# Patient Record
Sex: Female | Born: 1990 | Race: White | Hispanic: No | Marital: Single | Smoking: Never smoker
Health system: Southern US, Community
[De-identification: ages and names within clinical notes are randomized; demographics above are authoritative.]

## PROBLEM LIST (undated history)

## (undated) HISTORY — PX: TONSILLECTOMY: SUR1361

---

## 2019-03-24 ENCOUNTER — Emergency Department (HOSPITAL_COMMUNITY): Payer: No Typology Code available for payment source

## 2019-03-24 ENCOUNTER — Other Ambulatory Visit: Payer: Self-pay

## 2019-03-24 ENCOUNTER — Encounter (HOSPITAL_COMMUNITY): Payer: Self-pay | Admitting: Emergency Medicine

## 2019-03-24 ENCOUNTER — Emergency Department (HOSPITAL_COMMUNITY)
Admission: EM | Admit: 2019-03-24 | Discharge: 2019-03-25 | Disposition: A | Discharge status: Home / Self Care | Payer: No Typology Code available for payment source | Attending: Emergency Medicine | Admitting: Emergency Medicine

## 2019-03-24 DIAGNOSIS — J1289 Other viral pneumonia: Secondary | ICD-10-CM | POA: Insufficient documentation

## 2019-03-24 DIAGNOSIS — R0602 Shortness of breath: Secondary | ICD-10-CM | POA: Diagnosis present

## 2019-03-24 DIAGNOSIS — U071 COVID-19: Secondary | ICD-10-CM

## 2019-03-24 DIAGNOSIS — Z79899 Other long term (current) drug therapy: Secondary | ICD-10-CM | POA: Diagnosis not present

## 2019-03-24 DIAGNOSIS — J129 Viral pneumonia, unspecified: Secondary | ICD-10-CM

## 2019-03-24 LAB — COMPREHENSIVE METABOLIC PANEL
ALT: 29 U/L (ref 0–44)
AST: 21 U/L (ref 15–41)
Albumin: 4 g/dL (ref 3.5–5.0)
Alkaline Phosphatase: 55 U/L (ref 38–126)
Anion gap: 12 (ref 5–15)
BUN: 11 mg/dL (ref 6–20)
CO2: 23 mmol/L (ref 22–32)
Calcium: 9 mg/dL (ref 8.9–10.3)
Chloride: 106 mmol/L (ref 98–111)
Creatinine, Ser: 0.61 mg/dL (ref 0.44–1.00)
GFR calc Af Amer: >60 mL/min (ref >60)
GFR calc non Af Amer: >60 mL/min (ref >60)
Glucose, Bld: 87 mg/dL (ref 70–99)
Potassium: 3.7 mmol/L (ref 3.5–5.1)
Sodium: 141 mmol/L (ref 135–145)
Total Bilirubin: 0.6 mg/dL (ref 0.3–1.2)
Total Protein: 7.5 g/dL (ref 6.5–8.1)

## 2019-03-24 LAB — CBC WITH DIFFERENTIAL/PLATELET
Abs Immature Granulocytes: 0.02 10*3/uL (ref 0.00–0.07)
Basophils Absolute: 0 10*3/uL (ref 0.0–0.1)
Basophils Relative: 0 %
Eosinophils Absolute: 0 10*3/uL (ref 0.0–0.5)
Eosinophils Relative: 1 %
HCT: 42.8 % (ref 36.0–46.0)
Hemoglobin: 14 g/dL (ref 12.0–15.0)
Immature Granulocytes: 0 %
Lymphocytes Relative: 33 %
Lymphs Abs: 1.5 10*3/uL (ref 0.7–4.0)
MCH: 30.4 pg (ref 26.0–34.0)
MCHC: 32.7 g/dL (ref 30.0–36.0)
MCV: 92.8 fL (ref 80.0–100.0)
Monocytes Absolute: 0.4 10*3/uL (ref 0.1–1.0)
Monocytes Relative: 9 %
Neutro Abs: 2.6 10*3/uL (ref 1.7–7.7)
Neutrophils Relative %: 57 %
Platelets: 247 10*3/uL (ref 150–400)
RBC: 4.61 MIL/uL (ref 3.87–5.11)
RDW: 12.9 % (ref 11.5–15.5)
WBC: 4.7 10*3/uL (ref 4.0–10.5)
nRBC: 0 % (ref 0.0–0.2)

## 2019-03-24 LAB — TROPONIN I (HIGH SENSITIVITY): Troponin I (High Sensitivity): <2 ng/L (ref <18)

## 2019-03-24 MED ORDER — IOHEXOL 350 MG/ML SOLN
100.0000 mL | Freq: Once | INTRAVENOUS | Status: AC | PRN
  Administered 2019-03-25: 100 mL via INTRAVENOUS

## 2019-03-24 MED ORDER — SODIUM CHLORIDE 0.9 % IV SOLN: Freq: Once | INTRAVENOUS | Status: DC

## 2019-03-24 NOTE — ED Triage Notes (Signed)
Pt C/O SOB that started last night. Pt also C/o chest pain. Pt had positive COVID on October 30th.

## 2019-03-24 NOTE — ED Provider Notes (Signed)
[facility] EMERGENCY DEPARTMENT Provider Note   CSN: [id number] Arrival date & time: 03/24/19  1921     History   Chief Complaint Chief Complaint  Patient presents with  . Shortness of Breath    HPI Laura Cannon is a 28 y.o. female history of C-section, tonsillectomy, no chronic medical conditions.  Patient reports diagnosis of COVID-19 virus on 03/16/2019.  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  Patient reports she developed symptoms on 03/15/2019.  She reports that since her diagnosis she has had intermittent body aches and a mild nonproductive cough, she reports that she had been feeling well until today around 2 PM.  She reports that she developed a chest pain a sharp stabbing sensation primarily in the lower part of her chest but occasionally radiates around to different areas of her chest.  She reports this pain is worse with coughing.  She denies any clear alleviating factors.  She reports she is also been feeling short of breath increasingly over the past 2 days.  Reports intermittent fevers up to 101 F no fever in the past few days.  She denies headache, neck pain, hemoptysis, abdominal pain, nausea/vomiting, diarrhea, extremity pain/swelling, recent surgery/immobilization, history of blood clot or exogenous hormone use.     HPI  History reviewed. No pertinent past medical history.  There are no active problems to display for this patient.   Past Surgical History:  Procedure Laterality Date  . CESAREAN SECTION    . TONSILLECTOMY       OB History   No obstetric history on file.      Home Medications    Prior to Admission medications   Medication Sig Start Date End Date Taking? Authorizing Provider  dextroamphetamine (DEXEDRINE SPANSULE) 15 MG 24 hr capsule Take 15 mg by mouth daily.   Yes [provider]  dextroamphetamine (DEXTROSTAT) 10 MG tablet Take 5 mg by mouth daily.   Yes  [provider]  guaiFENesin (MUCINEX) 600 MG 12 hr tablet Take 600 mg by mouth 2 (two) times daily as needed for cough or to loosen phlegm.   Yes [provider]  vitamin C (ASCORBIC ACID) 500 MG tablet Take 500 mg by mouth daily.   Yes [provider]  Vitamin D, Cholecalciferol, 10 MCG (400 UNIT) CAPS Take 1 capsule by mouth daily.   Yes [provider]  azithromycin (ZITHROMAX Z-PAK) 250 MG tablet Take 500 mg (2 tablets) on day 1, then 250 mg (1 tablet) once daily for the next 4 days. 03/25/19   Harlene Salts A, PA-C  predniSONE (DELTASONE) 10 MG tablet Take 5 tablets (50 mg total) by mouth daily for 4 days. 03/25/19 03/29/19  Harlene Salts A, PA-C  zinc gluconate 50 MG tablet Take 1 tablet (50 mg total) by mouth daily. 03/25/19   Bill Salinas, PA-C    Family History No family history on file.  Social History Social History   Tobacco Use  . Smoking status: Never Smoker  . Smokeless tobacco: Never Used  Substance Use Topics  . Alcohol use: Not Currently  . Drug use: Not Currently     Allergies   Patient has no known allergies.   Review of Systems Review of Systems Ten systems are reviewed and are negative for acute change except as noted in the HPI   Physical Exam Updated Vital Signs BP 125/90   Pulse 90  Temp 98.3 F (36.8 C) (Oral)   Resp (!) 23   Ht 5\' 6"  (1.676 m)   Wt 94.8 kg   LMP 03/22/2019   SpO2 100%   BMI 33.73 kg/m   Physical Exam Constitutional:      General: She is not in acute distress.    Appearance: Normal appearance. She is well-developed. She is not ill-appearing or diaphoretic.  HENT:     Head: Normocephalic and atraumatic.     Right Ear: External ear normal.     Left Ear: External ear normal.     Nose: Nose normal.  Eyes:     General: Vision grossly intact. Gaze aligned appropriately.     Pupils: Pupils are equal, round, and reactive to light.  Neck:     Musculoskeletal: Normal range of  motion.     Trachea: Trachea and phonation normal. No tracheal deviation.  Cardiovascular:     Rate and Rhythm: Normal rate and regular rhythm.     Pulses: Normal pulses.     Heart sounds: Normal heart sounds.  Pulmonary:     Effort: Pulmonary effort is normal. No accessory muscle usage or respiratory distress.     Breath sounds: Normal breath sounds.  Abdominal:     General: There is no distension.     Palpations: Abdomen is soft.     Tenderness: There is no abdominal tenderness. There is no guarding or rebound.  Musculoskeletal: Normal range of motion.  Skin:    General: Skin is warm and dry.  Neurological:     Mental Status: She is alert.     GCS: GCS eye subscore is 4. GCS verbal subscore is 5. GCS motor subscore is 6.     Comments: Speech is clear and goal oriented, follows commands Major Cranial nerves without deficit, no facial droop Moves extremities without ataxia, coordination intact  Psychiatric:        Behavior: Behavior normal.    ED Treatments / Results  Labs (all labs ordered are listed, but only abnormal results are displayed) Labs Reviewed  CBC WITH DIFFERENTIAL/PLATELET  COMPREHENSIVE METABOLIC PANEL  TROPONIN I (HIGH SENSITIVITY)    EKG EKG Interpretation  Date/Time:  Saturday March 24 2019 19:36:07 EST Ventricular Rate:  111 PR Interval:  116 QRS Duration: 82 QT Interval:  314 QTC Calculation: 427 R Axis:   129 Text Interpretation: Sinus tachycardia Right axis deviation T wave abnormality, consider inferior ischemia Abnormal ECG Confirmed by Vanetta MuldersZackowski, Scott [phone]([id number]) on 03/24/2019 7:42:03 PM   Radiology Ct Angio Chest Pe W And/or Wo Contrast  Result Date: 03/25/2019 CLINICAL DATA:  Shortness of breath EXAM: CT ANGIOGRAPHY CHEST WITH CONTRAST TECHNIQUE: Multidetector CT imaging of the chest was performed using the standard protocol during bolus administration of intravenous contrast. Multiplanar CT image reconstructions and MIPs were obtained to  evaluate the vascular anatomy. CONTRAST:  100mL OMNIPAQUE IOHEXOL 350 MG/ML SOLN COMPARISON:  None. FINDINGS: Cardiovascular: Evaluation for pulmonary emboli is limited by respiratory motion artifact.Given these limitations, no PE was identified. The main pulmonary artery is dilated measuring approximately 3.1 cm. There is no CT evidence of acute right heart strain. The visualized aorta is normal. Heart size is normal. There is a trace pericardial effusion. Mediastinum/Nodes: --there is presumed mild reactive adenopathy within the mediastinum. --No axillary lymphadenopathy. --No supraclavicular lymphadenopathy. --Normal thyroid gland. --The esophagus is unremarkable Lungs/Pleura: There are bibasilar ground-glass airspace opacities and consolidation. There are smaller ground-glass airspace opacities throughout the remaining lobes. There is no pneumothorax. No  large pleural effusion. The trachea is unremarkable. Upper Abdomen: No acute abnormality detected in the upper abdomen. Musculoskeletal: No chest wall abnormality. No acute or significant osseous findings. Review of the MIP images confirms the above findings. IMPRESSION: 1. Evaluation for pulmonary emboli is limited by respiratory motion artifact. Given this limitation, no PE was identified. 2. Multifocal ground-glass airspace opacities with areas of consolidation at the lung bases is concerning for multifocal pneumonia (viral or bacterial). Electronically Signed   By: Katherine Mantle M.D.   On: 03/25/2019 00:37   Dg Chest Portable 1 View  Result Date: 03/24/2019 CLINICAL DATA:  Shortness of breath with chest pain EXAM: PORTABLE CHEST 1 VIEW COMPARISON:  None. FINDINGS: There are possible streaky hazy ground-glass bilateral airspace opacities. The lung volumes are somewhat low. The heart size is normal. There is no pneumothorax or significant pleural effusion. There is no acute osseous abnormality. IMPRESSION: Subtle streaky bilateral airspace opacities  nonspecific but can be seen in patients with an atypical infectious process such as viral pneumonia. Electronically Signed   By: Katherine Mantle M.D.   On: 03/24/2019 20:20    Procedures Procedures (including critical care time)  Medications Ordered in ED Medications  0.9 %  sodium chloride infusion (has no administration in time range)  predniSONE (DELTASONE) tablet 60 mg (has no administration in time range)  albuterol (VENTOLIN HFA) 108 (90 Base) MCG/ACT inhaler 1-2 puff (has no administration in time range)  iohexol (OMNIPAQUE) 350 MG/ML injection 100 mL (100 mLs Intravenous Contrast Given 03/25/19 0005)     Initial Impression / Assessment and Plan / ED Course  I have reviewed the triage vital signs and the nursing notes.  Pertinent labs & imaging results that were available during my care of the patient were reviewed by me and considered in my medical decision making (see chart for details).  Clinical Course as of Mar 24 112  Sat Mar 24, 2019  2248 CT PE study, if negative prednisone 60 mg here and 5-day burst. Multivitamin and Zinc ( ) vitamin D.   [BM]    Clinical Course User Index [BM] Harlene Salts A, PA-C      CBC within normal limits CMP within normal limits High-sensitivity troponin: <2, pain onset 2 PM, no indication for delta troponin Chest x-ray:  IMPRESSION:  Subtle streaky bilateral airspace opacities nonspecific but can be  seen in patients with an atypical infectious process such as viral  pneumonia.   EKG: Sinus tachycardia Right axis deviation T wave abnormality, consider inferior ischemia Abnormal ECG Confirmed by Vanetta Mulders [phone]) on 03/24/2019 7:42:03 PM - Case discussed with Dr. Deretha Emory, likely tachycardia secondary to her known COVID-19 viral infection however with new chest pain will obtain CT pulmonary embolism study.  Pending no pulmonary embolism plan of care that is if negative for pulmonary embolism to treat patient with  prednisone burst, multivitamin with zinc and vitamin D and discharge. - CT Angio PE Study: IMPRESSION:  1. Evaluation for pulmonary emboli is limited by respiratory motion  artifact. Given this limitation, no PE was identified.  2. Multifocal ground-glass airspace opacities with areas of  consolidation at the lung bases is concerning for multifocal  pneumonia (viral or bacterial).  - Patient with multifocal groundglass opacities consistent with her known COVID-19 viral infection.  She has no recent fever, no leukocytosis, I have low suspicion for bacterial pneumonia at this time however as the radiologist does read as possible bacteria I discussed the case with Dr. Lynelle Doctor will cover  patient with Z-Pak.  As per previous care plan will treat patient with prednisone burst, multivitamin, zinc and vitamin D.  Patient is to follow-up with PCP in the next 2 days for recheck and to return to the emergency department immediately for any new or worsening symptoms. - On reassessment patient is resting comfortably no acute distress heart rate approximately 90 bpm, SPO2 100% on room air, no tachypnea, respirations unlabored, blood pressure stable.  Patient feels well she states understanding of care plan and is agreeable to above.  She will be given albuterol inhaler as needed, she has no further questions or concerns she states understanding to follow-up with PCP this week and return to the ER for any new or worsening symptoms.  At this time there does not appear to be any evidence of an acute emergency medical condition and the patient appears stable for discharge with appropriate outpatient follow up. Diagnosis was discussed with patient who verbalizes understanding of care plan and is agreeable to discharge. I have discussed return precautions with patient who verbalizes understanding of return precautions. Patient encouraged to follow-up with their PCP. All questions answered.  Jaedin Trumbo was evaluated in  Emergency Department on 03/25/2019 for the symptoms described in the history of present illness. She was evaluated in the context of the global COVID-19 pandemic, which necessitated consideration that the patient might be at risk for infection with the SARS-CoV-2 virus that causes COVID-19. Institutional protocols and algorithms that pertain to the evaluation of patients at risk for COVID-19 are in a state of rapid change based on information released by regulatory bodies including the CDC and federal and state organizations. These policies and algorithms were followed during the patient's care in the ED.  Note: Portions of this report may have been transcribed using voice recognition software. Every effort was made to ensure accuracy; however, inadvertent computerized transcription errors may still be present. Final Clinical Impressions(s) / ED Diagnoses   Final diagnoses:  [id number] virus detected  Viral pneumonia    ED Discharge Orders         Ordered    predniSONE (DELTASONE) 10 MG tablet  Daily     03/25/19 0100    zinc gluconate 50 MG tablet  Daily     03/25/19 0100    azithromycin (ZITHROMAX Z-PAK) 250 MG tablet     03/25/19 0104           Deliah Boston, PA-C 03/25/19 0114    Fredia Sorrow, MD 03/28/19 1831

## 2019-03-25 MED ORDER — ZINC GLUCONATE 50 MG PO TABS: 50.0000 mg | ORAL_TABLET | Freq: Every day | ORAL | 0 refills | Status: AC

## 2019-03-25 MED ORDER — PREDNISONE 10 MG PO TABS: 50.0000 mg | ORAL_TABLET | Freq: Every day | ORAL | 0 refills | Status: AC

## 2019-03-25 MED ORDER — AZITHROMYCIN 250 MG PO TABS: ORAL_TABLET | ORAL | 0 refills | Status: AC

## 2019-03-25 MED ORDER — ALBUTEROL SULFATE HFA 108 (90 BASE) MCG/ACT IN AERS
1.0000 | INHALATION_SPRAY | Freq: Once | RESPIRATORY_TRACT | Status: AC
  Administered 2019-03-25: 2 via RESPIRATORY_TRACT
  Filled 2019-03-25: qty 6.7

## 2019-03-25 MED ORDER — PREDNISONE 50 MG PO TABS
60.0000 mg | ORAL_TABLET | Freq: Once | ORAL | Status: AC
  Administered 2019-03-25: 60 mg via ORAL
  Filled 2019-03-25: qty 1

## 2019-03-25 NOTE — Discharge Instructions (Addendum)
You have been diagnosed today with COVID-19 virus, viral pneumonia.  At this time there does not appear to be the presence of an emergent medical condition, however there is always the potential for conditions to change. Please read and follow the below instructions.  Please return to the Emergency Department immediately for any new or worsening symptoms or if your symptoms do not improve within 3 days. Please be sure to follow up with your Primary Care Provider within one week regarding your visit today; please call their office to schedule an appointment even if you are feeling better for a follow-up visit. Your CT scan today showed pneumonia which is likely viral related to your COVID-19 infection, however there is a chance this could be due to a bacteria as well, please take the antibiotic azithromycin as prescribed for treatment of possible bacterial causes. You have been given your first dose of prednisone today, this is a steroid medication, please continue your prednisone burst starting tomorrow with 50 mg daily until complete to help with your symptoms. Please take the zinc supplement prescribed to you today to help with your symptoms, please also begin taking a multivitamin containing vitamin D to help with your symptoms.   Please call your primary care doctor's office tomorrow to schedule a follow-up appointment and return to the emergency department immediately for any new or worsening symptoms. Please continue to self quarantine until symptom-free for 10 days.  Get help right away if: You have worsening shortness of breath. You have increased chest pain. Your sickness becomes worse, especially if you are an older adult or have a weakened immune system. You cough up blood. You have any new/concerning or worsening of symptoms.  Please read the additional information packets attached to your discharge summary.  Do not take your medicine if  develop an itchy rash, swelling in your mouth  or lips, or difficulty breathing; call 911 and seek immediate emergency medical attention if this occurs.  Note: Portions of this text may have been transcribed using voice recognition software. Every effort was made to ensure accuracy; however, inadvertent computerized transcription errors may still be present.

## 2020-11-15 IMAGING — CT CT ANGIO CHEST
2 of 6 series · 17 of 46 positions shown · IV contrast (omnipaque)
Comparison: None.

CLINICAL DATA: Shortness of breath

EXAM:
CT ANGIOGRAPHY CHEST WITH CONTRAST
TECHNIQUE: Multidetector CT imaging of the chest was performed using the
standard protocol during bolus administration of intravenous
contrast. Multiplanar CT image reconstructions and MIPs were
obtained to evaluate the vascular anatomy.
CONTRAST:  100mL OMNIPAQUE IOHEXOL 350 MG/ML SOLN

[Series 5: pe axial thins · axial · 0.79mm/px · z∈[+1149,+1372]mm · 14 of 245 slices shown]
[im 11/245  lung]
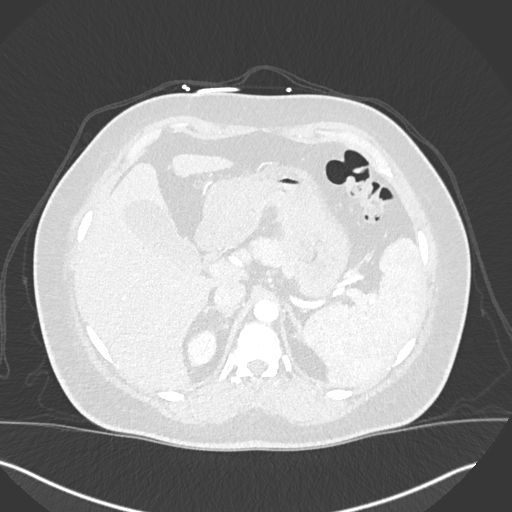
[im 32/245  soft-tissue]
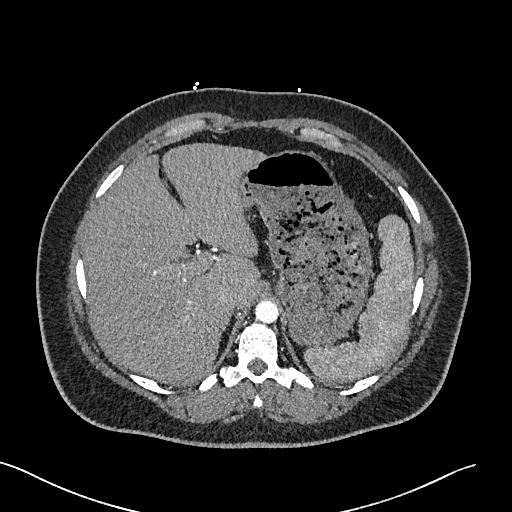
[im 43/245  lung]
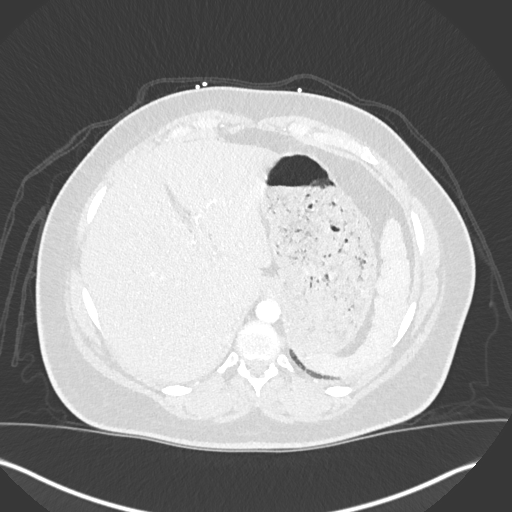
[im 64/245  soft-tissue]
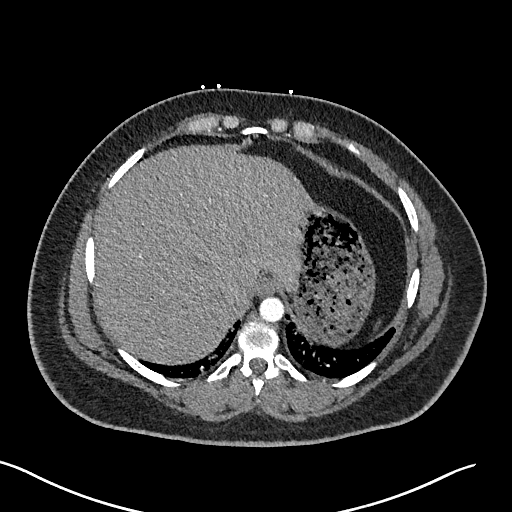
[im 85/245  lung]
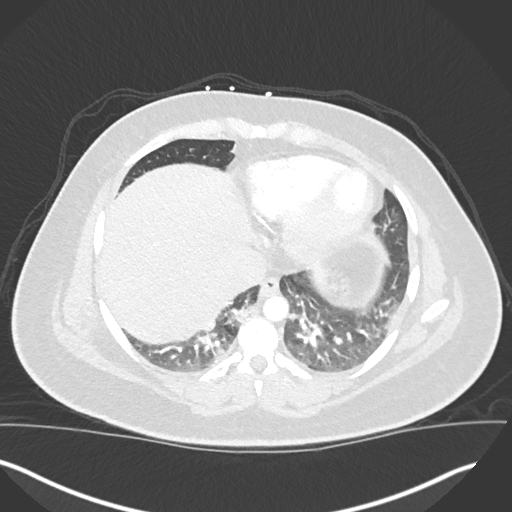
[im 96/245  soft-tissue]
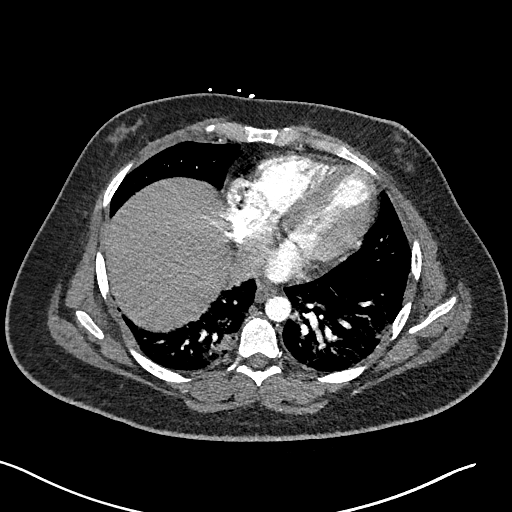
[im 117/245  lung]
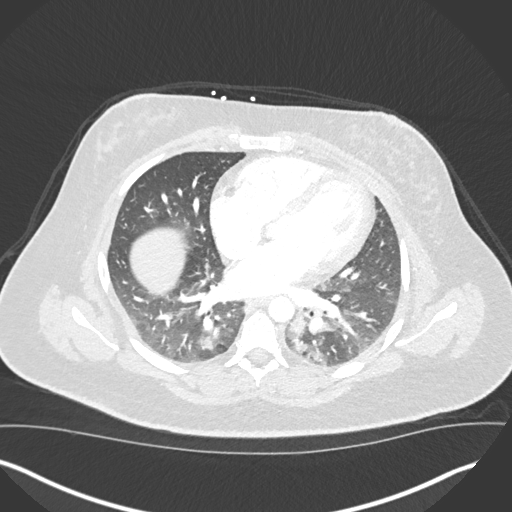
[im 128/245  soft-tissue]
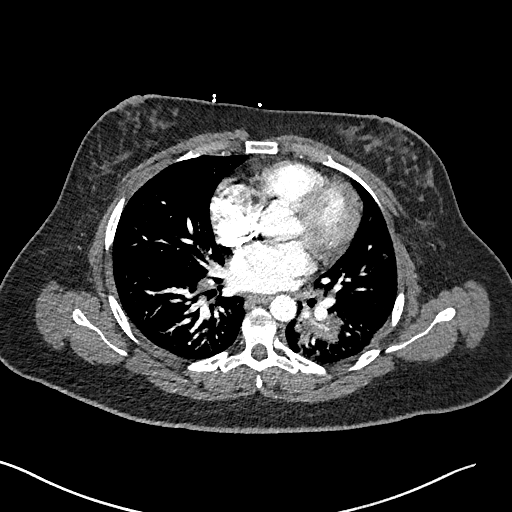
[im 149/245  lung]
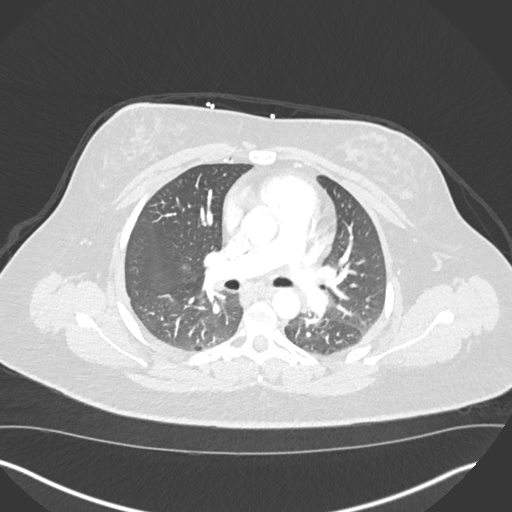
[im 160/245  soft-tissue]
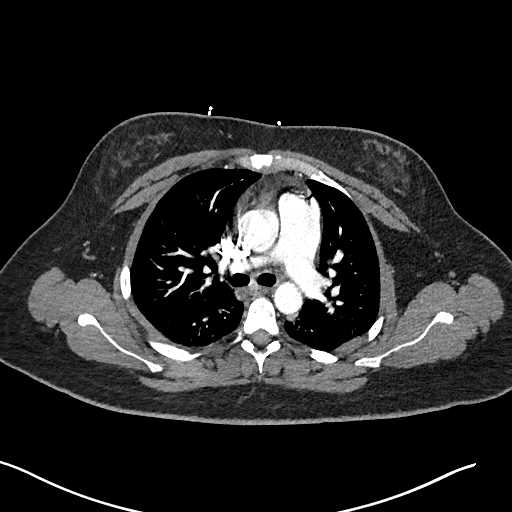
[im 181/245  lung]
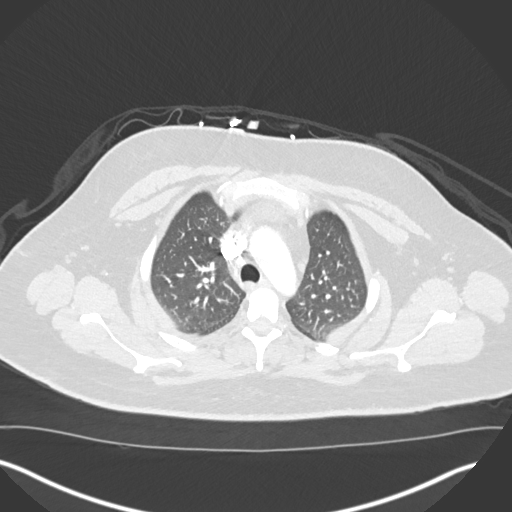
[im 202/245  soft-tissue]
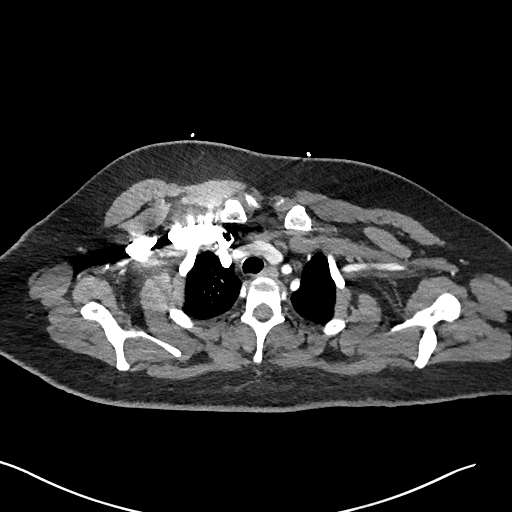
[im 213/245  lung]
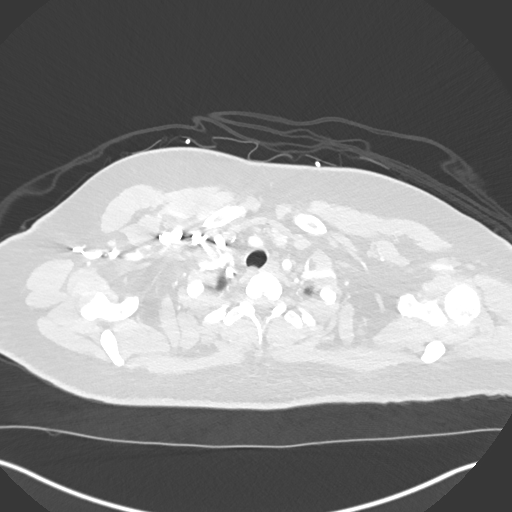
[im 234/245  soft-tissue]
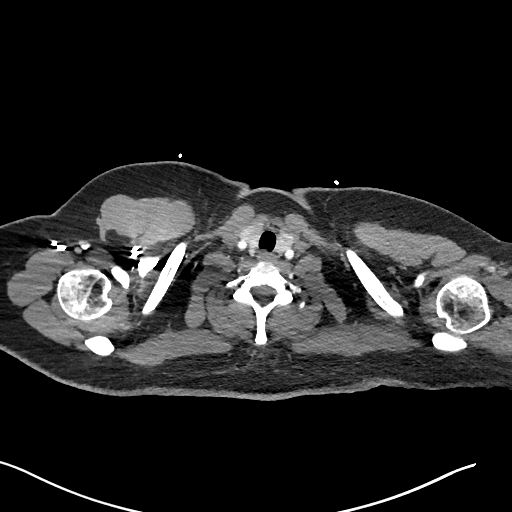

[Series 7: cor soft · coronal · 0.52mm/px · 3 of 146 slices shown]
[im 37/146  soft-tissue]
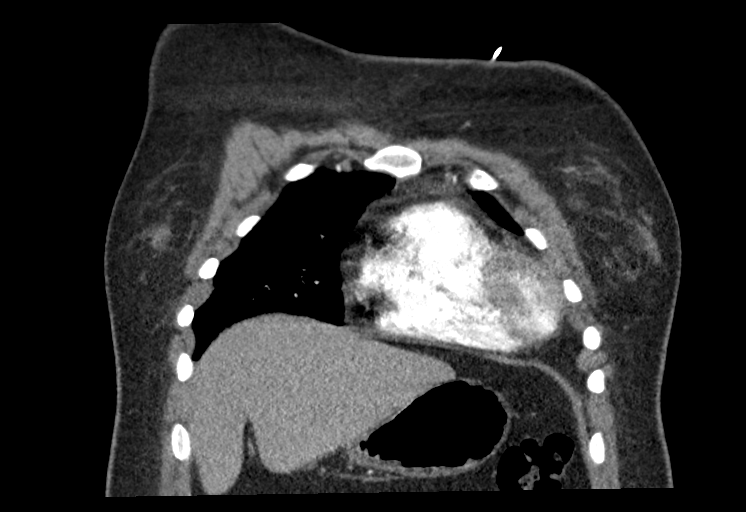
[im 73/146  soft-tissue]
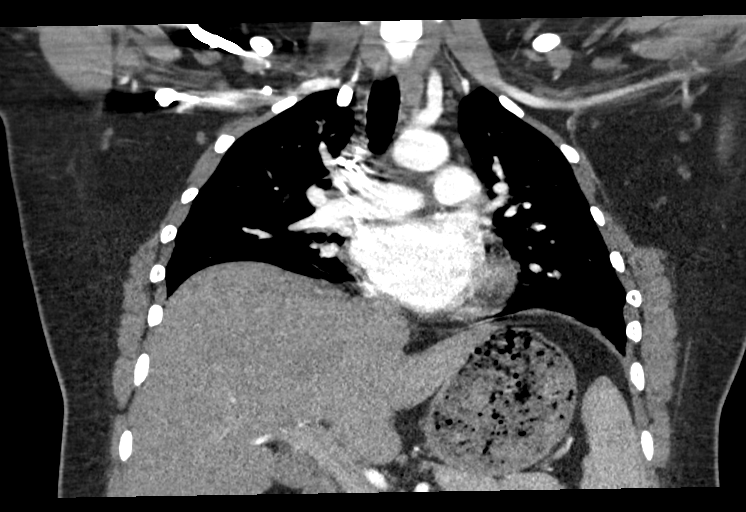
[im 109/146  soft-tissue]
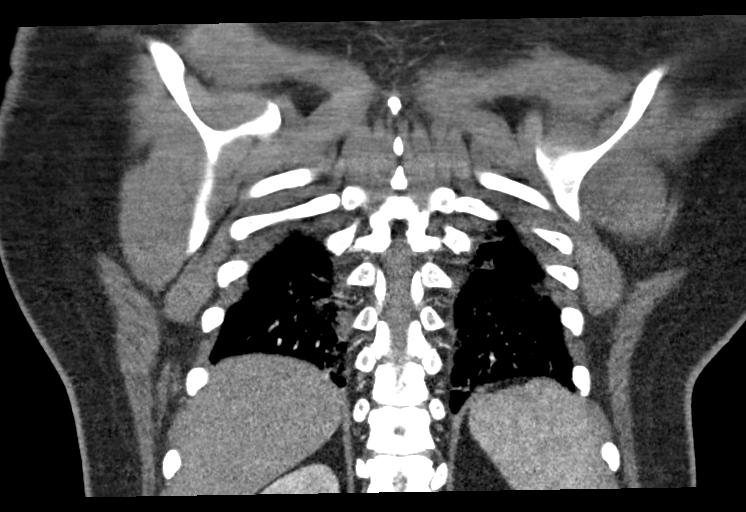

[17 of 46 positions shown; findings below may reference images not displayed]

FINDINGS: Cardiovascular: Evaluation for pulmonary emboli is limited by
respiratory motion artifact.Given these limitations, no PE was
identified. The main pulmonary artery is dilated measuring
approximately 3.1 cm. There is no CT evidence of acute right heart
strain. The visualized aorta is normal. Heart size is normal. There
is a trace pericardial effusion.

Mediastinum/Nodes:

--there is presumed mild reactive adenopathy within the mediastinum.

--No axillary lymphadenopathy.

--No supraclavicular lymphadenopathy.

--Normal thyroid gland.

--The esophagus is unremarkable

Lungs/Pleura: There are bibasilar ground-glass airspace opacities
and consolidation. There are smaller ground-glass airspace opacities
throughout the remaining lobes. There is no pneumothorax. No large
pleural effusion. The trachea is unremarkable.

Upper Abdomen: No acute abnormality detected in the upper abdomen.

Musculoskeletal: No chest wall abnormality. No acute or significant
osseous findings.

Review of the MIP images confirms the above findings.
IMPRESSION: 1. Evaluation for pulmonary emboli is limited by respiratory motion
artifact. Given this limitation, no PE was identified.
2. Multifocal ground-glass airspace opacities with areas of
consolidation at the lung bases is concerning for multifocal
pneumonia (viral or bacterial).
